# Patient Record
Sex: Male | Born: 1965 | Race: Black or African American | Hispanic: No | Marital: Single | Smoking: Current every day smoker
Health system: Southern US, Community
[De-identification: ages and names within clinical notes are randomized; demographics above are authoritative.]

## PROBLEM LIST (undated history)

## (undated) HISTORY — PX: FEMUR IM NAIL: SHX1597

---

## 2001-06-07 ENCOUNTER — Emergency Department (HOSPITAL_COMMUNITY): Admission: EM | Admit: 2001-06-07 | Discharge: 2001-06-07 | Payer: Self-pay | Admitting: Emergency Medicine

## 2001-06-07 ENCOUNTER — Encounter: Payer: Self-pay | Admitting: Emergency Medicine

## 2002-06-29 ENCOUNTER — Emergency Department (HOSPITAL_COMMUNITY): Admission: EM | Admit: 2002-06-29 | Discharge: 2002-06-29 | Payer: Self-pay | Admitting: *Deleted

## 2003-04-26 ENCOUNTER — Emergency Department (HOSPITAL_COMMUNITY): Admission: EM | Admit: 2003-04-26 | Discharge: 2003-04-26 | Payer: Self-pay | Admitting: Emergency Medicine

## 2003-06-25 ENCOUNTER — Emergency Department (HOSPITAL_COMMUNITY): Admission: EM | Admit: 2003-06-25 | Discharge: 2003-06-25 | Payer: Self-pay | Admitting: *Deleted

## 2003-08-07 ENCOUNTER — Encounter: Payer: Self-pay | Admitting: *Deleted

## 2003-08-07 ENCOUNTER — Emergency Department (HOSPITAL_COMMUNITY): Admission: EM | Admit: 2003-08-07 | Discharge: 2003-08-07 | Payer: Self-pay | Admitting: *Deleted

## 2003-11-07 ENCOUNTER — Emergency Department (HOSPITAL_COMMUNITY): Admission: EM | Admit: 2003-11-07 | Discharge: 2003-11-08 | Payer: Self-pay | Admitting: *Deleted

## 2003-11-16 ENCOUNTER — Emergency Department (HOSPITAL_COMMUNITY): Admission: EM | Admit: 2003-11-16 | Discharge: 2003-11-16 | Payer: Self-pay | Admitting: *Deleted

## 2005-12-11 ENCOUNTER — Inpatient Hospital Stay (HOSPITAL_COMMUNITY): Admission: EM | Admit: 2005-12-11 | Discharge: 2005-12-20 | Payer: Self-pay

## 2005-12-11 ENCOUNTER — Ambulatory Visit: Payer: Self-pay | Admitting: Physical Medicine & Rehabilitation

## 2005-12-20 ENCOUNTER — Inpatient Hospital Stay (HOSPITAL_COMMUNITY)
Admission: RE | Admit: 2005-12-20 | Discharge: 2005-12-28 | Payer: Self-pay | Admitting: Physical Medicine & Rehabilitation

## 2006-02-07 ENCOUNTER — Encounter (HOSPITAL_COMMUNITY): Admission: RE | Admit: 2006-02-07 | Discharge: 2006-03-09 | Payer: Self-pay | Admitting: Orthopedic Surgery

## 2009-04-03 ENCOUNTER — Emergency Department (HOSPITAL_COMMUNITY): Admission: EM | Admit: 2009-04-03 | Discharge: 2009-04-03 | Payer: Self-pay | Admitting: Emergency Medicine

## 2009-10-03 ENCOUNTER — Emergency Department (HOSPITAL_COMMUNITY): Admission: EM | Admit: 2009-10-03 | Discharge: 2009-10-03 | Payer: Self-pay | Admitting: Emergency Medicine
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
## 2011-04-30 ENCOUNTER — Emergency Department (HOSPITAL_COMMUNITY): Payer: Self-pay

## 2011-04-30 ENCOUNTER — Emergency Department (HOSPITAL_COMMUNITY)
Admission: EM | Admit: 2011-04-30 | Discharge: 2011-04-30 | Disposition: A | Discharge status: Home / Self Care | Payer: Self-pay | Attending: Emergency Medicine | Admitting: Emergency Medicine

## 2011-04-30 DIAGNOSIS — S02609A Fracture of mandible, unspecified, initial encounter for closed fracture: Secondary | ICD-10-CM | POA: Insufficient documentation

## 2011-04-30 DIAGNOSIS — R51 Headache: Secondary | ICD-10-CM | POA: Insufficient documentation

## 2011-04-30 DIAGNOSIS — IMO0002 Reserved for concepts with insufficient information to code with codable children: Secondary | ICD-10-CM | POA: Insufficient documentation

## 2015-12-18 ENCOUNTER — Encounter (HOSPITAL_COMMUNITY): Payer: Self-pay

## 2015-12-18 ENCOUNTER — Emergency Department (HOSPITAL_COMMUNITY)
Admission: EM | Admit: 2015-12-18 | Discharge: 2015-12-18 | Disposition: A | Discharge status: Home / Self Care | Payer: Self-pay | Attending: Emergency Medicine | Admitting: Emergency Medicine

## 2015-12-18 DIAGNOSIS — M79605 Pain in left leg: Secondary | ICD-10-CM | POA: Insufficient documentation

## 2015-12-18 DIAGNOSIS — M549 Dorsalgia, unspecified: Secondary | ICD-10-CM | POA: Insufficient documentation

## 2015-12-18 DIAGNOSIS — Z87828 Personal history of other (healed) physical injury and trauma: Secondary | ICD-10-CM | POA: Insufficient documentation

## 2015-12-18 MED ORDER — NAPROXEN 500 MG PO TABS: 500.0000 mg | ORAL_TABLET | Freq: Two times a day (BID) | ORAL | Status: AC

## 2015-12-18 MED ORDER — NAPROXEN 250 MG PO TABS
500.0000 mg | ORAL_TABLET | Freq: Once | ORAL | Status: AC
  Administered 2015-12-18: 500 mg via ORAL
  Filled 2015-12-18: qty 2

## 2015-12-18 MED ORDER — HYDROCODONE-ACETAMINOPHEN 5-325 MG PO TABS
1.0000 | ORAL_TABLET | Freq: Once | ORAL | Status: AC
  Administered 2015-12-18: 1 via ORAL
  Filled 2015-12-18: qty 1

## 2015-12-18 NOTE — ED Notes (Signed)
Pt alert & oriented x4, stable gait. Patient given discharge instructions, paperwork & prescription(s). Patient  instructed to stop at the registration desk to finish any additional paperwork. Patient verbalized understanding. Pt left department w/ no further questions. 
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
## 2015-12-18 NOTE — ED Provider Notes (Signed)
CSN: [id number]     Arrival date & time 12/18/15  0020 History  By signing my name below, I, [name], attest that this documentation has been prepared under the direction and in the presence of Shon Baton, MD.  Electronically Signed: [name], ED Scribe. 12/18/2015. 12:57 AM.    Chief Complaint  Patient presents with  . Leg Pain    The history is provided by the patient. No language interpreter was used.   HPI Comments: [name] is a 50 y.o. male who presents to the Emergency Department complaining of constant, chronic in nature leg pain in left leg, that worsened tonight. Pt currently rates pain 7-8/10. Pt also reports back pain. Pt states that pain is exacerbated in the morning. No alleviating factors at this time. Pt denies recent injury or trauma. Pt has attempted OTC tylenol with no improvement. No recent fever or chills.  Reports old trauma but no new trauma.  History reviewed. No pertinent past medical history. Past Surgical History  Procedure Laterality Date  . Femur im nail     No family history on file. Social History  Substance Use Topics  . Smoking status: None  . Smokeless tobacco: None  . Alcohol Use: None    Review of Systems  Constitutional: Negative for fever and chills.  Genitourinary: Negative for difficulty urinating.  Musculoskeletal: Positive for arthralgias.  Neurological: Negative for weakness and numbness.  All other systems reviewed and are negative.     Allergies  Review of patient's allergies indicates no known allergies.  Home Medications   Prior to Admission medications   Medication Sig Start Date End Date Taking? Authorizing Provider  naproxen (NAPROSYN) 500 MG tablet Take 1 tablet (500 mg total) by mouth 2 (two) times daily with a meal. 12/18/15   Shon Baton, MD   There were no vitals taken for this visit. Physical Exam  Constitutional: He is oriented to person, place, and time. No distress.  HENT:   Head: Normocephalic and atraumatic.  Cardiovascular: Normal rate, regular rhythm and normal heart sounds.   Pulmonary/Chest: Effort normal and breath sounds normal. No respiratory distress.  Musculoskeletal: He exhibits no edema.  Full range of motion of the left knee and left hip, no obvious deformities, 5 out of 5 strength, no overlying skin changes, 2+ DP pulses, no swelling  Neurological: He is alert and oriented to person, place, and time.  Skin: Skin is warm and dry.  Psychiatric: He has a normal mood and affect.  Strange affect, perseverates and is difficult to get direct answers during history taking  Nursing note and vitals reviewed.   ED Course  Procedures  DIAGNOSTIC STUDIES:   COORDINATION OF CARE:  12:56 AM-Discussed treatment plan which includes pain medication with pt at bedside and pt agreed to plan.   Labs Review  Labs Reviewed - No data to display  Imaging Review No results found. I have personally reviewed and evaluated these images and lab results as part of my medical decision-making.   EKG Interpretation None      MDM   Final diagnoses:  Left leg pain   Patient presents with left leg pain. Appears acute on chronic. No new injury. History of old injury. Neurovascularly intact. Normal strength. He ambulates without difficulty. Patient was given naproxen and Norco. Discussed with patient anti-inflammatories at home and follow-up with orthopedics if pain continues. Database reviewed and no narcotic pain prescriptions in the last 6 months.  After history, exam, and  medical workup I feel the patient has been appropriately medically screened and is safe for discharge home. Pertinent diagnoses were discussed with the patient. Patient was given return precautions.   I personally performed the services described in this documentation, which was scribed in my presence. The recorded information has been reviewed and is accurate.     Shon Baton,  MD 12/18/15 [phone]

## 2020-07-12 ENCOUNTER — Emergency Department (HOSPITAL_COMMUNITY)
Admission: EM | Admit: 2020-07-12 | Discharge: 2020-07-12 | Disposition: A | Discharge status: Home / Self Care | Payer: HRSA Program | Attending: Emergency Medicine | Admitting: Emergency Medicine

## 2020-07-12 ENCOUNTER — Other Ambulatory Visit: Payer: Self-pay

## 2020-07-12 ENCOUNTER — Encounter (HOSPITAL_COMMUNITY): Payer: Self-pay | Admitting: Emergency Medicine

## 2020-07-12 DIAGNOSIS — Z0000 Encounter for general adult medical examination without abnormal findings: Secondary | ICD-10-CM | POA: Insufficient documentation

## 2020-07-12 DIAGNOSIS — Z20822 Contact with and (suspected) exposure to covid-19: Secondary | ICD-10-CM | POA: Insufficient documentation

## 2020-07-12 DIAGNOSIS — F17210 Nicotine dependence, cigarettes, uncomplicated: Secondary | ICD-10-CM | POA: Diagnosis not present

## 2020-07-12 DIAGNOSIS — Z139 Encounter for screening, unspecified: Secondary | ICD-10-CM

## 2020-07-12 NOTE — Discharge Instructions (Addendum)
Your Covid test is pending, you will be notified if your Covid test is positive.  Quarantine yourself at home, avoid contact with others at least to your test results are back.  If positive quarantine at home for it at least 10 days.  Tylenol if needed for fever.

## 2020-07-12 NOTE — ED Triage Notes (Signed)
Pt mother requesting pt to be tested.  Pt says he does not have any symptoms and has not been around anyone with COVID.

## 2020-07-13 LAB — SARS CORONAVIRUS 2 (TAT 6-24 HRS): SARS Coronavirus 2: NEGATIVE

## 2020-07-13 NOTE — ED Provider Notes (Signed)
[facility] EMERGENCY DEPARTMENT Provider Note   CSN: [id number] Arrival date & time: 07/12/20  1753     History Chief Complaint  Patient presents with  . COVID testing    [name] is a 54 y.o. male.  HPI      [name] is a 54 y.o. male who presents to the Emergency Department requesting a covid test.  Here at his mother's request.  States that someone told his mother that he has covid and he states that he wants a test to prove that he is not sick.  He denies any symptoms and has not been exposed to anyone with covid.    No past medical history on file.  There are no problems to display for this patient.   Past Surgical History:  Procedure Laterality Date  . FEMUR IM NAIL         No family history on file.  Social History   Tobacco Use  . Smoking status: Current Every Day Smoker    Packs/day: 0.50    Types: Cigarettes  . Smokeless tobacco: Never Used  Vaping Use  . Vaping Use: Never used  Substance Use Topics  . Alcohol use: Yes    Alcohol/week: 8.0 standard drinks    Types: 5 Cans of beer, 3 Glasses of wine per week  . Drug use: Never    Home Medications Prior to Admission medications   Medication Sig Start Date End Date Taking? Authorizing Provider  naproxen (NAPROSYN) 500 MG tablet Take 1 tablet (500 mg total) by mouth 2 (two) times daily with a meal. 12/18/15   Horton, Mayer Masker, MD    Allergies    Patient has no known allergies.  Review of Systems   Review of Systems  Constitutional: Negative for chills, fatigue and fever.  HENT: Negative for congestion, rhinorrhea, sore throat and trouble swallowing.   Respiratory: Negative for cough and shortness of breath.   Cardiovascular: Negative for chest pain.  Gastrointestinal: Negative for abdominal pain, diarrhea, nausea and vomiting.  Genitourinary: Negative for dysuria and flank pain.  Musculoskeletal: Negative for myalgias, neck pain and neck stiffness.  Skin: Negative for  rash.  Neurological: Negative for dizziness, weakness, numbness and headaches.  Hematological: Does not bruise/bleed easily.    Physical Exam Updated Vital Signs BP 136/88 (BP Location: Right Arm)   Pulse 92   Temp 98.9 F (37.2 C) (Oral)   Resp 16   Ht 5\' 10"  (1.778 m)   Wt 99.8 kg   SpO2 99%   BMI 31.57 kg/m   Physical Exam Vitals and nursing note reviewed.  Constitutional:      General: He is not in acute distress.    Appearance: Normal appearance. He is not ill-appearing or toxic-appearing.  HENT:     Nose: No congestion or rhinorrhea.     Mouth/Throat:     Mouth: Mucous membranes are moist.  Neck:     Thyroid: No thyromegaly.     Meningeal: Kernig's sign absent.  Cardiovascular:     Rate and Rhythm: Normal rate and regular rhythm.  Pulmonary:     Effort: Pulmonary effort is normal. No respiratory distress.     Breath sounds: Normal breath sounds. No wheezing.  Abdominal:     Palpations: Abdomen is soft.     Tenderness: There is no abdominal tenderness. There is no guarding or rebound.  Musculoskeletal:        General: Normal range of motion.     Cervical back:  Normal range of motion and neck supple.  Lymphadenopathy:     Cervical: No cervical adenopathy.  Skin:    General: Skin is warm.     Findings: No rash.  Neurological:     General: No focal deficit present.     Mental Status: He is alert.     Sensory: No sensory deficit.     Motor: No weakness.  Psychiatric:        Behavior: Behavior normal.        Thought Content: Thought content normal.     ED Results / Procedures / Treatments   Labs (all labs ordered are listed, but only abnormal results are displayed) Labs Reviewed  SARS CORONAVIRUS 2 (TAT 6-24 HRS)    EKG None  Radiology No results found.  Procedures Procedures (including critical care time)  Medications Ordered in ED Medications - No data to display  ED Course  I have reviewed the triage vital signs and the nursing  notes.  Pertinent labs & imaging results that were available during my care of the patient were reviewed by me and considered in my medical decision making (see chart for details).    MDM Rules/Calculators/A&P                          requesting a covid test.  Here at his mother's request.  States that someone told his mother that he has covid and he states that he wants a test to prove that he is not sick.  He denies any symptoms and has not been exposed to anyone with covid.    Clinical suspicion for covid is low.  Pt agrees to quarantine at home until results are back, 10 days if positive.    [name] was evaluated in Emergency Department on 07/13/2020 for the symptoms described in the history of present illness. He was evaluated in the context of the global COVID-19 pandemic, which necessitated consideration that the patient might be at risk for infection with the SARS-CoV-2 virus that causes COVID-19. Institutional protocols and algorithms that pertain to the evaluation of patients at risk for COVID-19 are in a state of rapid change based on information released by regulatory bodies including the CDC and federal and state organizations. These policies and algorithms were followed during the patient's care in the ED.  Final Clinical Impression(s) / ED Diagnoses Final diagnoses:  Encounter for medical screening examination    Rx / DC Orders ED Discharge Orders    None       Pauline Aus, PA-C 07/13/20 1558    Ronald Bale, MD 07/15/20 1149

## 2021-03-01 ENCOUNTER — Other Ambulatory Visit: Payer: Self-pay

## 2021-03-01 ENCOUNTER — Encounter (HOSPITAL_COMMUNITY): Payer: Self-pay

## 2021-03-01 ENCOUNTER — Emergency Department (HOSPITAL_COMMUNITY): Payer: Self-pay

## 2021-03-01 ENCOUNTER — Emergency Department (HOSPITAL_COMMUNITY)
Admission: EM | Admit: 2021-03-01 | Discharge: 2021-03-01 | Disposition: A | Discharge status: Home / Self Care | Payer: Self-pay | Attending: Emergency Medicine | Admitting: Emergency Medicine

## 2021-03-01 DIAGNOSIS — M79604 Pain in right leg: Secondary | ICD-10-CM | POA: Insufficient documentation

## 2021-03-01 DIAGNOSIS — F17210 Nicotine dependence, cigarettes, uncomplicated: Secondary | ICD-10-CM | POA: Insufficient documentation

## 2021-03-01 DIAGNOSIS — I10 Essential (primary) hypertension: Secondary | ICD-10-CM

## 2021-03-01 DIAGNOSIS — R569 Unspecified convulsions: Secondary | ICD-10-CM | POA: Insufficient documentation

## 2021-03-01 DIAGNOSIS — W19XXXA Unspecified fall, initial encounter: Secondary | ICD-10-CM | POA: Insufficient documentation

## 2021-03-01 LAB — CBC WITH DIFFERENTIAL/PLATELET
Abs Immature Granulocytes: 0.01 10*3/uL (ref 0.00–0.07)
Basophils Absolute: 0 10*3/uL (ref 0.0–0.1)
Basophils Relative: 0 %
Eosinophils Absolute: 0 10*3/uL (ref 0.0–0.5)
Eosinophils Relative: 0 %
HCT: 42.9 % (ref 39.0–52.0)
Hemoglobin: 13.4 g/dL (ref 13.0–17.0)
Immature Granulocytes: 0 %
Lymphocytes Relative: 13 %
Lymphs Abs: 0.7 10*3/uL (ref 0.7–4.0)
MCH: 30.5 pg (ref 26.0–34.0)
MCHC: 31.2 g/dL (ref 30.0–36.0)
MCV: 97.5 fL (ref 80.0–100.0)
Monocytes Absolute: 0.6 10*3/uL (ref 0.1–1.0)
Monocytes Relative: 11 %
Neutro Abs: 4 10*3/uL (ref 1.7–7.7)
Neutrophils Relative %: 76 %
Platelets: 214 10*3/uL (ref 150–400)
RBC: 4.4 MIL/uL (ref 4.22–5.81)
RDW: 15.3 % (ref 11.5–15.5)
WBC: 5.3 10*3/uL (ref 4.0–10.5)
nRBC: 0 % (ref 0.0–0.2)

## 2021-03-01 LAB — BASIC METABOLIC PANEL
Anion gap: 12 (ref 5–15)
BUN: 13 mg/dL (ref 6–20)
CO2: 27 mmol/L (ref 22–32)
Calcium: 9 mg/dL (ref 8.9–10.3)
Chloride: 105 mmol/L (ref 98–111)
Creatinine, Ser: 0.9 mg/dL (ref 0.61–1.24)
GFR, Estimated: >60 mL/min (ref >60)
Glucose, Bld: 116 mg/dL — ABNORMAL HIGH (ref 70–99)
Potassium: 3.9 mmol/L (ref 3.5–5.1)
Sodium: 144 mmol/L (ref 135–145)

## 2021-03-01 MED ORDER — HYDROCODONE-ACETAMINOPHEN 5-325 MG PO TABS
1.0000 | ORAL_TABLET | Freq: Once | ORAL | Status: AC
Start: 2021-03-01 — End: 2021-03-01
  Administered 2021-03-01: 1 via ORAL
  Filled 2021-03-01: qty 1

## 2021-03-01 NOTE — ED Triage Notes (Signed)
Patient reports that he fell last night, does not remember how. All he remembers is that his brother had to help him from the floor. Complaining of right leg pain. States that pain is much worse when bearing weight.

## 2021-03-01 NOTE — Discharge Instructions (Addendum)
You came to the emergency department to be assessed for your right leg pain.  Your physical exam was reassuring.  Your x-ray showed no broken bones or dislocations.  Your lab work and EKG showed no abnormalities.  The CT scan of your head showed no acute abnormalities.  Your symptoms are likely due to a musculoskeletal injury and should improve with time.  Please take Ibuprofen (Advil, motrin) and Tylenol (acetaminophen) to relieve your pain.  Please check your blood pressure 2-3 times per day and record these values chosen to your primary care provider.  You may take up to 600 MG (3 pills) of normal strength ibuprofen every 8 hours as needed.   You make take tylenol, up to 1,000 mg (two extra strength pills) every 8 hours as needed.   It is safe to take ibuprofen and tylenol at the same time as they work differently.   Do not take more than 3,000 mg tylenol in a 24 hour period (not more than one dose every 8 hours.  Please check all medication labels as many medications such as pain and cold medications may contain tylenol.  Do not drink alcohol while taking these medications.  Do not take other NSAID'S while taking ibuprofen (such as aleve or naproxen).  Please take ibuprofen with food to decrease stomach upset.   While in the emergency department you were noted to have a high blood pressure.  Important to follow-up with a primary care provider to have this reevaluated.    Please follow-up with your primary care provider.  If you do not have a primary care provider or health insurance you may follow-up with the [place] and wellness center.

## 2021-03-01 NOTE — ED Provider Notes (Signed)
[facility] EMERGENCY DEPARTMENT Provider Note   CSN: [id number] Arrival date & time: 03/01/21  1006     History Chief Complaint  Patient presents with  . Fall    [name] is a 55 y.o. male with no pertinent past medical history.  Patient presents with chief complaint of right lower extremity pain.  Pain is located to his right thigh and radiates down his leg.  Patient reports pain is worse with weightbearing and ambulation.  Patient denies any alleviating factors.  Patient rates his pain 10/10 on the pain scale.  Patient reports that yesterday he suffered a fall last night.  Patient does not remember the events of the fall.  Patient states that he remembers watching TV on the couch and the next thing he remembers is waking up this morning.  States that his brother reports that he heard him fall came into the room and helped him to his feet and then to his room.    Patient denies any head, neck, or back pain, saddle anesthesia, bowel or bladder dysfunction, numbness or tingling to extremities, weakness extremities.  He denies any slurred speech, facial asymmetry, focal neurological deficit, tremors, slurred speech, dizziness or lightheadedness.  Patient denies any history of seizures.  Patient endorses that yesterday he drank half a pint of liquor as well as 1 to 2 12 ounce beers.  Patient denies any drug use.  Patient denies daily alcohol use.    HPI     History reviewed. No pertinent past medical history.  There are no problems to display for this patient.   Past Surgical History:  Procedure Laterality Date  . FEMUR IM NAIL         History reviewed. No pertinent family history.  Social History   Tobacco Use  . Smoking status: Current Every Day Smoker    Packs/day: 0.50    Types: Cigarettes  . Smokeless tobacco: Never Used  Vaping Use  . Vaping Use: Never used  Substance Use Topics  . Alcohol use: Yes    Alcohol/week: 8.0 standard drinks    Types: 5  Cans of beer, 3 Glasses of wine per week  . Drug use: Never    Home Medications Prior to Admission medications   Medication Sig Start Date End Date Taking? Authorizing Provider  naproxen (NAPROSYN) 500 MG tablet Take 1 tablet (500 mg total) by mouth 2 (two) times daily with a meal. 12/18/15   Horton, Mayer Masker, MD    Allergies    Patient has no known allergies.  Review of Systems   Review of Systems  Constitutional: Negative for chills and fever.  Eyes: Negative for visual disturbance.  Respiratory: Negative for shortness of breath.   Cardiovascular: Negative for chest pain and leg swelling.  Gastrointestinal: Negative for abdominal pain, nausea and vomiting.  Genitourinary: Negative for difficulty urinating.  Musculoskeletal: Positive for arthralgias, gait problem and myalgias. Negative for back pain and neck pain.  Skin: Negative for color change and rash.  Neurological: Negative for dizziness, tremors, seizures, syncope, facial asymmetry, speech difficulty, weakness, light-headedness, numbness and headaches.  Psychiatric/Behavioral: Negative for confusion.    Physical Exam Updated Vital Signs BP (!) 148/94 (BP Location: Right Arm)   Pulse 81   Temp 98.4 F (36.9 C) (Oral)   Resp (!) 24   Ht 5\' 7"  (1.702 m)   Wt 99.8 kg   SpO2 100%   BMI 34.46 kg/m   Physical Exam Vitals and nursing note reviewed.  Constitutional:  General: He is not in acute distress.    Appearance: He is not ill-appearing, toxic-appearing or diaphoretic.  HENT:     Head: Normocephalic and atraumatic. No raccoon eyes, Battle's sign, abrasion, contusion, masses, right periorbital erythema, left periorbital erythema or laceration.     Jaw: No trismus or pain on movement.     Mouth/Throat:     Pharynx: Oropharynx is clear. Uvula midline. No pharyngeal swelling, oropharyngeal exudate, posterior oropharyngeal erythema or uvula swelling.  Eyes:     General: No scleral icterus.       Right eye: No  discharge.        Left eye: No discharge.     Extraocular Movements: Extraocular movements intact.     Pupils: Pupils are equal, round, and reactive to light.  Cardiovascular:     Rate and Rhythm: Normal rate.  Pulmonary:     Effort: Pulmonary effort is normal. No respiratory distress.  Abdominal:     Palpations: Abdomen is soft.     Tenderness: There is no abdominal tenderness.  Musculoskeletal:     Cervical back: Normal range of motion and neck supple. No swelling, edema, deformity, erythema, signs of trauma, lacerations, rigidity, spasms, torticollis, tenderness or bony tenderness. No pain with movement. Normal range of motion.     Thoracic back: No swelling, edema, deformity, signs of trauma, lacerations, spasms, tenderness or bony tenderness.     Lumbar back: No swelling, edema, deformity, signs of trauma, lacerations, spasms, tenderness or bony tenderness.     Right hip: Tenderness and bony tenderness present. No deformity or lacerations. Decreased range of motion (Decreased range of motion due to complaints of pain to right thigh).     Left hip: No deformity, lacerations, tenderness or bony tenderness. Normal range of motion.     Right upper leg: Tenderness and bony tenderness present. No swelling, edema, deformity or lacerations.     Left upper leg: No swelling, edema, deformity, lacerations, tenderness or bony tenderness.     Right knee: No swelling, deformity, effusion, erythema, ecchymosis, lacerations or bony tenderness. Normal range of motion. No tenderness.     Left knee: No swelling, deformity, effusion, erythema, ecchymosis, lacerations or bony tenderness. Normal range of motion. No tenderness.     Right lower leg: Normal.     Left lower leg: Normal.     Right ankle: No swelling, deformity, ecchymosis or lacerations. No tenderness. Normal range of motion. Normal pulse.     Left ankle: No swelling, deformity, ecchymosis or lacerations. No tenderness. Normal range of motion.  Normal pulse.     Right foot: No swelling, deformity, tenderness or bony tenderness. Normal pulse.     Left foot: No swelling, deformity, tenderness or bony tenderness. Normal pulse.     Comments: No shortening or rotation noted to right lower extremity  No midline tenderness, step-off, or deformity to cervical, thoracic or lumbar spine.   Skin:    General: Skin is warm and dry.  Neurological:     General: No focal deficit present.     Mental Status: He is alert.     GCS: GCS eye subscore is 4. GCS verbal subscore is 5. GCS motor subscore is 6.     Cranial Nerves: No cranial nerve deficit or facial asymmetry.     Motor: No weakness, tremor or seizure activity.     Coordination: Romberg sign negative. Finger-Nose-Finger Test normal.     Gait: Gait is intact.     Comments: CN II-XII intact,  equal grip strength, +5 strength to bilateral upper and lower extremities   Noted to have antalgic gait  Psychiatric:        Behavior: Behavior is cooperative.     ED Results / Procedures / Treatments   Labs (all labs ordered are listed, but only abnormal results are displayed) Labs Reviewed  BASIC METABOLIC PANEL - Abnormal; Notable for the following components:      Result Value   Glucose, Bld 116 (*)    All other components within normal limits  CBC WITH DIFFERENTIAL/PLATELET    EKG None  Radiology No results found.  Procedures Procedures   Medications Ordered in ED Medications  HYDROcodone-acetaminophen (NORCO/VICODIN) 5-325 MG per tablet 1 tablet (1 tablet Oral Given 03/01/21 1111)    ED Course  I have reviewed the triage vital signs and the nursing notes.  Pertinent labs & imaging results that were available during my care of the patient were reviewed by me and considered in my medical decision making (see chart for details).    MDM Rules/Calculators/A&P                          Alert and oriented 55 year old male no acute distress, nontoxic-appearing.  Patient presents  with chief complaint of right thigh pain after suffering a fall yesterday night.  Patient does not recall events leading up to or after the fall.  Patient reports that his brother states he heard the fall and checked on him immediately after.  Patient was able to be assisted off the ground into the bedroom.  Patient denies any bowel or bladder dysfunction, saddle anesthesia, numbness to extremities, weakness to extremities.  On physical exam patient has no focal neurological deficits.  No midline tenderness, step-off, or deformity to cervical, thoracic or lumbar spine. No shortening or rotation noted to right lower extremity.  She has tenderness to right hip and right thigh.  No deformity noted.  Patient has decreased range of motion to right hip due to complaints of pain.  Pulse, motor, and sensation intact distally to patient's pain.  No swelling or tenderness noted to right lower extremity.  Low suspicion for DVT.    Will obtain x-ray of right hip, femur, and knee to evaluate for fracture or dislocation.  You fall being unwitnessed and patient having no recollection of the event will obtain contrast head CT.  Due to possible syncopal episode will also obtain EKG, CBC, BMP.  Will give patient Norco for pain management.  BMP and CBC are unremarkable.  EKG evaluated by myself and attending physician; shows sinus rhythm.  Noncontrast head CT showed no acute intracranial abnormality.   No fractures or dislocations were noted on right hip, right femur, and right knee x-ray imaging.  Reports improvement in his pain symptoms.  He was able to stand and ambulate without assistance.  Patient noted to be hypertensive throughout his emergency department stay.  Patient denies any history of hypertension however has does not see a primary care provider regularly.  Patient advised to chart his blood pressures and follow-up with [place] [facility].  Discussed results, findings, treatment and follow up.  Patient advised of return precautions. Patient verbalized understanding and agreed with plan.     Final Clinical Impression(s) / ED Diagnoses Final diagnoses:  Fall    Rx / DC Orders ED Discharge Orders    None       Haskel Schroeder, PA-C 03/01/21 1343  Bethann Berkshire, MD 03/03/21 [phone]

## 2022-07-27 IMAGING — DX DG PELVIS 1-2V
1 series · 1 of 1 positions shown · non-contrast
Comparison: December 12, 2015

CLINICAL DATA: Right leg pain after fall

EXAM:
PELVIS - 1-2 VIEW

[pelvis ap]
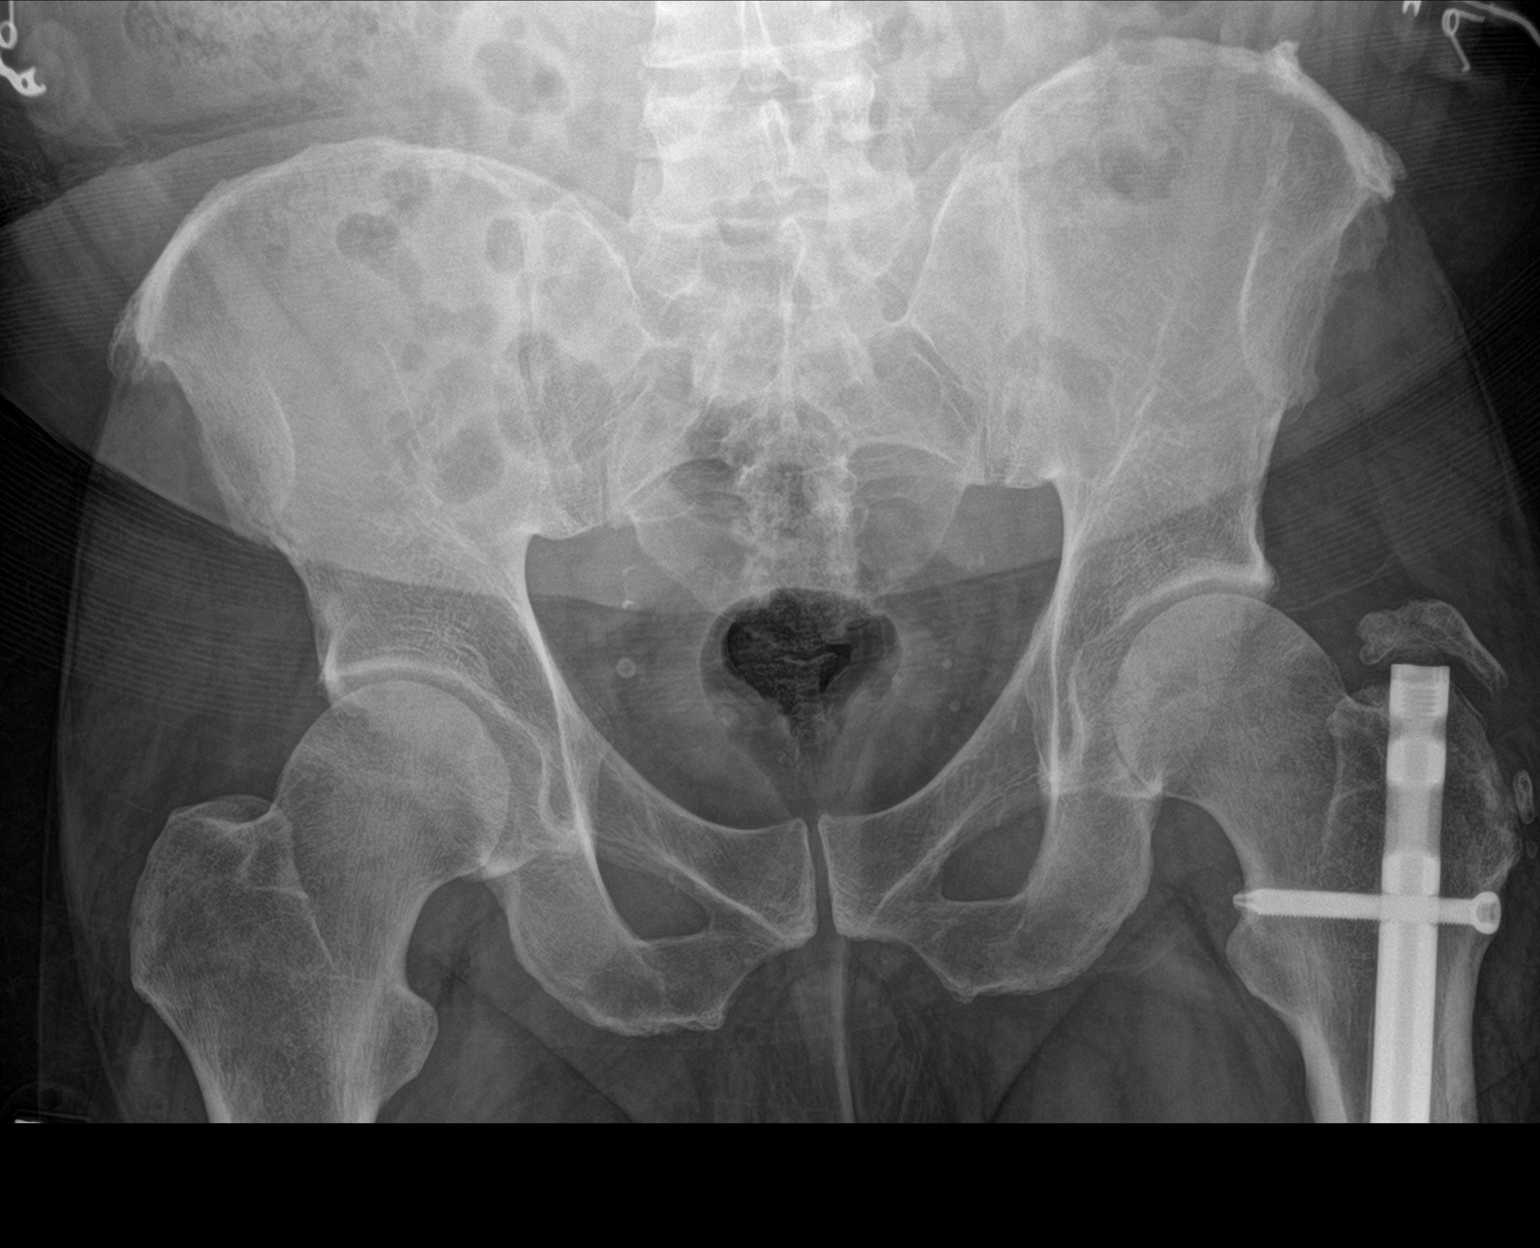

[1 of 1 positions shown; findings below may reference images not displayed]

FINDINGS: There is no evidence of pelvic fracture or diastasis. No pelvic bone
lesions are seen. Left hip intramedullary rod and screw fixation.
Pelvic phleboliths. Lumbar spondylosis.
IMPRESSION: 1. No acute osseous abnormality visualized.

## 2024-12-05 DIAGNOSIS — M5431 Sciatica, right side: Secondary | ICD-10-CM | POA: Insufficient documentation

## 2024-12-06 ENCOUNTER — Emergency Department (HOSPITAL_COMMUNITY)
Admission: EM | Admit: 2024-12-06 | Discharge: 2024-12-06 | Disposition: A | Discharge status: Home / Self Care | Payer: Self-pay

## 2024-12-06 ENCOUNTER — Encounter (HOSPITAL_COMMUNITY): Payer: Self-pay

## 2024-12-06 DIAGNOSIS — M5431 Sciatica, right side: Secondary | ICD-10-CM

## 2024-12-06 MED ORDER — METHOCARBAMOL 500 MG PO TABS: 1000.0000 mg | ORAL_TABLET | Freq: Four times a day (QID) | ORAL | 0 refills | Status: AC | PRN

## 2024-12-06 MED ORDER — NAPROXEN 500 MG PO TABS: 500.0000 mg | ORAL_TABLET | Freq: Two times a day (BID) | ORAL | 0 refills | Status: AC

## 2024-12-06 MED ORDER — METHOCARBAMOL 500 MG PO TABS
1000.0000 mg | ORAL_TABLET | Freq: Once | ORAL | Status: AC
  Administered 2024-12-06: 1000 mg via ORAL
  Filled 2024-12-06: qty 2

## 2024-12-06 MED ORDER — KETOROLAC TROMETHAMINE 15 MG/ML IJ SOLN
15.0000 mg | Freq: Once | INTRAMUSCULAR | Status: AC
  Administered 2024-12-06: 15 mg via INTRAMUSCULAR
  Filled 2024-12-06: qty 1

## 2024-12-06 NOTE — ED Provider Notes (Signed)
 " [place] EMERGENCY DEPARTMENT AT [facility] Provider Note   CSN: [id number] Arrival date & time: 12/05/24  2352     Patient presents with: Leg Pain   Sierra Bissonette is a 59 y.o. male.   59 year old male presents for evaluation of leg pain.  States has been going on for a few days.  States it starts in his lower back and radiates down his butt and into his posterior right knee.  Denies any falls or injuries.  He states has been taking ibuprofen and it has been helping.  Denies any other symptoms or concerns.   Leg Pain Associated symptoms: back pain   Associated symptoms: no fever        Prior to Admission medications  Medication Sig Start Date End Date Taking? Authorizing Provider  methocarbamol  (ROBAXIN ) 500 MG tablet Take 2 tablets (1,000 mg total) by mouth every 6 (six) hours as needed for up to 7 days for muscle spasms. 12/06/24 12/13/24 Yes Carrson Lightcap L, DO  naproxen  (NAPROSYN ) 500 MG tablet Take 1 tablet (500 mg total) by mouth 2 (two) times daily for 7 days. 12/06/24 12/13/24 Yes Tiyana Galla L, DO  naproxen  (NAPROSYN ) 500 MG tablet Take 1 tablet (500 mg total) by mouth 2 (two) times daily with a meal. 12/18/15   Horton, Charmaine FALCON, MD    Allergies: Patient has no known allergies.    Review of Systems  Constitutional:  Negative for chills and fever.  HENT:  Negative for ear pain and sore throat.   Eyes:  Negative for pain and visual disturbance.  Respiratory:  Negative for cough and shortness of breath.   Cardiovascular:  Negative for chest pain and palpitations.  Gastrointestinal:  Negative for abdominal pain and vomiting.  Genitourinary:  Negative for dysuria and hematuria.  Musculoskeletal:  Positive for back pain. Negative for arthralgias.  Skin:  Negative for color change and rash.  Neurological:  Negative for seizures and syncope.  All other systems reviewed and are negative.   Updated Vital Signs BP (!) 170/119 (BP Location: Right Arm)    Pulse 95   Temp 97.8 F (36.6 C) (Oral)   Resp 19   Ht 5' 7 (1.702 m)   Wt 100 kg   SpO2 99%   BMI 34.53 kg/m   Physical Exam Vitals and nursing note reviewed.  Constitutional:      General: He is not in acute distress.    Appearance: Normal appearance. He is well-developed. He is not ill-appearing.  HENT:     Head: Normocephalic and atraumatic.  Eyes:     Conjunctiva/sclera: Conjunctivae normal.  Cardiovascular:     Rate and Rhythm: Normal rate and regular rhythm.     Heart sounds: No murmur heard. Pulmonary:     Effort: Pulmonary effort is normal. No respiratory distress.     Breath sounds: Normal breath sounds.  Abdominal:     Palpations: Abdomen is soft.     Tenderness: There is no abdominal tenderness.  Musculoskeletal:        General: No swelling, tenderness, deformity or signs of injury. Normal range of motion.     Cervical back: Neck supple.  Skin:    General: Skin is warm and dry.     Capillary Refill: Capillary refill takes less than 2 seconds.  Neurological:     Mental Status: He is alert.  Psychiatric:        Mood and Affect: Mood normal.     (all labs  ordered are listed, but only abnormal results are displayed) Labs Reviewed - No data to display  EKG: None  Radiology: No results found.   Procedures   Medications Ordered in the ED  ketorolac  (TORADOL ) 15 MG/ML injection 15 mg (15 mg Intramuscular Given 12/06/24 0446)  methocarbamol  (ROBAXIN ) tablet 1,000 mg (1,000 mg Oral Given 12/06/24 0445)                                    Medical Decision Making Patient here for right leg pain that is likely sciatica.  He is able to ambulate without difficulty.  Will give him Toradol  here and start him on muscle relaxers and give him a prescription for naproxen .  He is also advised Tylenol  as needed for pain and follow-up with primary care doctor.  Advised return to the ER for new or worsening symptoms.  He feels comfortable being discharged  home.  Problems Addressed: Sciatica of right side: acute illness or injury  Amount and/or Complexity of Data Reviewed External Data Reviewed: notes.    Details: Prior ED records reviewed and patient seen 03-01-2021 for fall  Risk OTC drugs. Prescription drug management.     Final diagnoses:  Sciatica of right side    ED Discharge Orders          Ordered    naproxen  (NAPROSYN ) 500 MG tablet  2 times daily        12/06/24 0442    methocarbamol  (ROBAXIN ) 500 MG tablet  Every 6 hours PRN        12/06/24 0442               Clorine Swing L, DO 12/06/24 0544  "

## 2024-12-06 NOTE — Discharge Instructions (Signed)
 Take your naprosyn  as prescribed. Take your robaxin  up to 4 times a day as needed. You can also use tylenol  as needed for pain

## 2024-12-06 NOTE — ED Triage Notes (Signed)
 Pov from home. Cc of right leg pain.  [place] it hurts when he gets up. Ambulatory to triage.  Been bothering him a few weeks.  Denies injury

## 2024-12-20 ENCOUNTER — Emergency Department (HOSPITAL_COMMUNITY): Payer: Self-pay

## 2024-12-20 ENCOUNTER — Emergency Department (HOSPITAL_COMMUNITY)
Admission: EM | Admit: 2024-12-20 | Discharge: 2024-12-21 | Disposition: A | Payer: Self-pay | Attending: Emergency Medicine | Admitting: Emergency Medicine

## 2024-12-20 DIAGNOSIS — M25561 Pain in right knee: Secondary | ICD-10-CM | POA: Insufficient documentation

## 2024-12-20 LAB — CBC WITH DIFFERENTIAL/PLATELET
Abs Immature Granulocytes: 0.01 K/uL (ref 0.00–0.07)
Basophils Absolute: 0.1 K/uL (ref 0.0–0.1)
Basophils Relative: 1 %
Eosinophils Absolute: 0.1 K/uL (ref 0.0–0.5)
Eosinophils Relative: 1 %
HCT: 42.4 % (ref 39.0–52.0)
Hemoglobin: 13.7 g/dL (ref 13.0–17.0)
Immature Granulocytes: 0 %
Lymphocytes Relative: 31 %
Lymphs Abs: 1.7 K/uL (ref 0.7–4.0)
MCH: 29 pg (ref 26.0–34.0)
MCHC: 32.3 g/dL (ref 30.0–36.0)
MCV: 89.8 fL (ref 80.0–100.0)
Monocytes Absolute: 0.5 K/uL (ref 0.1–1.0)
Monocytes Relative: 10 %
Neutro Abs: 3.1 K/uL (ref 1.7–7.7)
Neutrophils Relative %: 57 %
Platelets: 342 K/uL (ref 150–400)
RBC: 4.72 MIL/uL (ref 4.22–5.81)
RDW: 13.9 % (ref 11.5–15.5)
WBC: 5.4 K/uL (ref 4.0–10.5)
nRBC: 0 % (ref 0.0–0.2)

## 2024-12-20 LAB — COMPREHENSIVE METABOLIC PANEL WITH GFR
ALT: 19 U/L (ref 0–44)
AST: 31 U/L (ref 15–41)
Albumin: 4.6 g/dL (ref 3.5–5.0)
Alkaline Phosphatase: 85 U/L (ref 38–126)
Anion gap: 19 — ABNORMAL HIGH (ref 5–15)
BUN: 11 mg/dL (ref 6–20)
CO2: 21 mmol/L — ABNORMAL LOW (ref 22–32)
Calcium: 9.5 mg/dL (ref 8.9–10.3)
Chloride: 102 mmol/L (ref 98–111)
Creatinine, Ser: 1.35 mg/dL — ABNORMAL HIGH (ref 0.61–1.24)
GFR, Estimated: >60 mL/min
Glucose, Bld: 120 mg/dL — ABNORMAL HIGH (ref 70–99)
Potassium: 3.7 mmol/L (ref 3.5–5.1)
Sodium: 142 mmol/L (ref 135–145)
Total Bilirubin: 0.3 mg/dL (ref 0.0–1.2)
Total Protein: 8 g/dL (ref 6.5–8.1)

## 2024-12-20 LAB — CK: Total CK: 378 U/L (ref 49–397)

## 2024-12-20 MED ORDER — LACTATED RINGERS IV BOLUS
1000.0000 mL | Freq: Once | INTRAVENOUS | Status: AC
  Administered 2024-12-20: 1000 mL via INTRAVENOUS

## 2024-12-20 MED ORDER — ACETAMINOPHEN 500 MG PO TABS
1000.0000 mg | ORAL_TABLET | Freq: Once | ORAL | Status: AC
  Administered 2024-12-20: 1000 mg via ORAL
  Filled 2024-12-20: qty 2

## 2024-12-20 MED ORDER — SODIUM CHLORIDE 0.9 % IV BOLUS
1000.0000 mL | Freq: Once | INTRAVENOUS | Status: AC
  Administered 2024-12-20: 1000 mL via INTRAVENOUS

## 2024-12-20 MED ORDER — ENOXAPARIN SODIUM 100 MG/ML IJ SOSY
1.0000 mg/kg | PREFILLED_SYRINGE | Freq: Once | INTRAMUSCULAR | Status: AC
  Administered 2024-12-21: 100 mg via SUBCUTANEOUS
  Filled 2024-12-20: qty 1

## 2024-12-20 NOTE — ED Notes (Signed)
Pt aware of needed urine sample--unable to void at this time.

## 2024-12-20 NOTE — Discharge Instructions (Addendum)
IMPORTANT PATIENT INSTRUCTIONS:  Your ED provider has recommended an Outpatient Ultrasound.  Please call [phone] to schedule an appointment.  If your appointment is scheduled for a Saturday, Sunday or holiday, please go to the [place] [facility] Registration Desk at least 15 minutes prior to your appointment time and tell them you are there for an ultrasound.    If your appointment is scheduled for a weekday (Monday-Friday), please go directly to the [place] [facility] at least 15 minutes prior to your appointment time and tell them you are there for an ultrasound.  Please call [phone] with questions. 

## 2024-12-20 NOTE — ED Provider Notes (Signed)
 " [place] EMERGENCY DEPARTMENT AT [facility] Provider Note   CSN: [id number] Arrival date & time: 12/20/24  8143     Patient presents with: Leg Pain (right)   [name] is a 59 y.o. male.   HPI 59 year old male presents with right leg pain.  He has had atraumatic right sided leg pain behind his knee and around his knee for about 1 week.  Hurts when he flexes his knee.  Also hurts when he tries to get up to walk.  No leg swelling or numbness.  No new back pain.  No fevers, chest pain, shortness of breath.  Has been taking some sort of home medicine but he does remember the name of it, it has not helped.   As I was evaluating him, his blood pressure was noted to be in the 90s.  He seems asymptomatic from this.  He does endorse some recent diarrhea.  No blood.  Prior to Admission medications  Medication Sig Start Date End Date Taking? Authorizing Provider  naproxen  (NAPROSYN ) 500 MG tablet Take 1 tablet (500 mg total) by mouth 2 (two) times daily with a meal. 12/18/15   Horton, Charmaine FALCON, MD    Allergies: Patient has no known allergies.    Review of Systems  Constitutional:  Negative for fever.  Respiratory:  Negative for shortness of breath.   Cardiovascular:  Negative for chest pain and leg swelling.  Musculoskeletal:  Positive for myalgias.  Neurological:  Negative for weakness and numbness.    Updated Vital Signs BP (!) 93/59 (BP Location: Right Arm)   Pulse 86   Temp 97.9 F (36.6 C)   Resp 18   Ht 5' 7 (1.702 m)   Wt 100 kg   SpO2 97%   BMI 34.53 kg/m   Physical Exam Vitals and nursing note reviewed.  Constitutional:      General: He is not in acute distress.    Appearance: He is well-developed. He is not ill-appearing or diaphoretic.  HENT:     Head: Normocephalic and atraumatic.  Cardiovascular:     Rate and Rhythm: Normal rate and regular rhythm.     Pulses:          Dorsalis pedis pulses are 2+ on the right side.  Pulmonary:      Effort: Pulmonary effort is normal.  Abdominal:     General: There is no distension.     Palpations: Abdomen is soft.     Tenderness: There is no abdominal tenderness.  Musculoskeletal:     Right knee: No swelling, deformity, effusion or erythema. Normal range of motion. Tenderness (mild) present.     Comments: Some mild pain behind his right knee.  No obvious mass/cyst.  No erythema or swelling to the right knee.  Normal range of motion though does induce some pain.  Can straight leg raise without difficulty.  No thigh pain/tenderness or cellulitis appreciated.  No calf swelling.  Skin:    General: Skin is warm and dry.  Neurological:     Mental Status: He is alert.     (all labs ordered are listed, but only abnormal results are displayed) Labs Reviewed - No data to display  EKG: None  Radiology: DG Knee Complete 4 Views Right Result Date: 12/20/2024 CLINICAL DATA:  Right knee pain EXAM: RIGHT KNEE - COMPLETE 4+ VIEW COMPARISON:  03/01/2021 FINDINGS: No fracture or malalignment. Mild medial and lateral degenerative spurring. Probable knee effusion IMPRESSION: Mild degenerative changes with probable  knee effusion. Electronically Signed   By: Luke Bun M.D.   On: 12/20/2024 19:53     Procedures   Medications Ordered in the ED  sodium chloride 0.9 % bolus 1,000 mL (has no administration in time range)  acetaminophen  (TYLENOL ) tablet 1,000 mg (has no administration in time range)                                    Medical Decision Making Amount and/or Complexity of Data Reviewed Labs: ordered.    Details: Elevated anion gap, mild increase in creatinine Radiology: ordered and independent interpretation performed.    Details: No fracture  Risk OTC drugs. Prescription drug management.   Patient with a trauma right leg pain.  Recommend he get a DVT ultrasound tomorrow.  However his blood pressures were found to be low on multiple different measurements.  Labs were  obtained and he was given some fluids.  He was given Tylenol  for pain.  Will give him a dose of Lovenox for possible DVT now that his hemoglobin is normal.  He does have an elevated anion gap and mildly elevated creatinine.  Will give IV fluids.  He has no PE symptoms.  Will send lactate given the acidosis but at this point sepsis seems highly unlikely, more likely dehydration. Care transferred to Dr. Roselyn.     Final diagnoses:  None    ED Discharge Orders          Ordered    US  Venous Img Lower Unilateral Right        12/20/24 2206               Freddi Hamilton, MD 12/20/24 2346  "

## 2024-12-20 NOTE — ED Triage Notes (Signed)
 Pt comes in for right leg pain. Leg pain started about a week ago. Pt denies any trauma/ injury to the leg. A&Ox4.

## 2024-12-21 LAB — LACTIC ACID, PLASMA: Lactic Acid, Venous: 2 mmol/L (ref 0.5–1.9)

## 2024-12-21 NOTE — ED Provider Notes (Signed)
 Care assumed at shift change, here for atraumatic R knee pain. Had reassuring workup but BP was low on reassessment. Improved with IVF. Physical Exam  BP 114/73   Pulse 78   Temp 97.9 F (36.6 C)   Resp 18   Ht 5' 7 (1.702 m)   Wt 100 kg   SpO2 92%   BMI 34.53 kg/m   Physical Exam  Procedures  Procedures  ED Course / MDM   Clinical Course as of 12/21/24 0044  Sat Dec 21, 2024  0042 Lactic acid is not significantly elevated and no other signs of sepsis. Will place in knee brace for comfort and have him return in AM for scheduled US  per primary team plan.  [CS]    Clinical Course User Index [CS] Roselyn Carlin NOVAK, MD   Medical Decision Making Problems Addressed: Acute pain of right knee: acute illness or injury  Amount and/or Complexity of Data Reviewed Labs:  Decision-making details documented in ED Course.  Risk OTC drugs. Prescription drug management.          Roselyn Carlin NOVAK, MD 12/21/24 [phone]
